# Patient Record
Sex: Female | Born: 1975 | Race: Black or African American | Hispanic: No | Smoking: Never smoker
Health system: Southern US, Community
[De-identification: ages and names within clinical notes are randomized; demographics above are authoritative.]

## PROBLEM LIST (undated history)

## (undated) DIAGNOSIS — I2699 Other pulmonary embolism without acute cor pulmonale: Secondary | ICD-10-CM

## (undated) DIAGNOSIS — I10 Essential (primary) hypertension: Secondary | ICD-10-CM

## (undated) DIAGNOSIS — IMO0002 Reserved for concepts with insufficient information to code with codable children: Secondary | ICD-10-CM

## (undated) DIAGNOSIS — I4891 Unspecified atrial fibrillation: Secondary | ICD-10-CM

## (undated) DIAGNOSIS — M329 Systemic lupus erythematosus, unspecified: Secondary | ICD-10-CM

## (undated) DIAGNOSIS — I509 Heart failure, unspecified: Secondary | ICD-10-CM

## (undated) DIAGNOSIS — R569 Unspecified convulsions: Secondary | ICD-10-CM

## (undated) DIAGNOSIS — E119 Type 2 diabetes mellitus without complications: Secondary | ICD-10-CM

## (undated) DIAGNOSIS — K859 Acute pancreatitis without necrosis or infection, unspecified: Secondary | ICD-10-CM

## (undated) DIAGNOSIS — J45909 Unspecified asthma, uncomplicated: Secondary | ICD-10-CM

## (undated) HISTORY — PX: CARDIAC SURGERY: SHX584

## (undated) HISTORY — PX: CORONARY ARTERY BYPASS GRAFT: SHX141

---

## 2017-06-25 ENCOUNTER — Emergency Department (HOSPITAL_COMMUNITY): Payer: Medicare Other

## 2017-06-25 ENCOUNTER — Emergency Department (HOSPITAL_COMMUNITY)
Admission: EM | Admit: 2017-06-25 | Discharge: 2017-06-25 | Disposition: A | Discharge status: Home / Self Care | Payer: Medicare Other | Attending: Emergency Medicine | Admitting: Emergency Medicine

## 2017-06-25 ENCOUNTER — Encounter (HOSPITAL_COMMUNITY): Payer: Self-pay

## 2017-06-25 DIAGNOSIS — R079 Chest pain, unspecified: Secondary | ICD-10-CM | POA: Diagnosis present

## 2017-06-25 DIAGNOSIS — E119 Type 2 diabetes mellitus without complications: Secondary | ICD-10-CM | POA: Insufficient documentation

## 2017-06-25 DIAGNOSIS — J45909 Unspecified asthma, uncomplicated: Secondary | ICD-10-CM | POA: Insufficient documentation

## 2017-06-25 DIAGNOSIS — I509 Heart failure, unspecified: Secondary | ICD-10-CM | POA: Insufficient documentation

## 2017-06-25 DIAGNOSIS — R600 Localized edema: Secondary | ICD-10-CM | POA: Diagnosis not present

## 2017-06-25 DIAGNOSIS — I110 Hypertensive heart disease with heart failure: Secondary | ICD-10-CM | POA: Insufficient documentation

## 2017-06-25 DIAGNOSIS — Z951 Presence of aortocoronary bypass graft: Secondary | ICD-10-CM | POA: Diagnosis not present

## 2017-06-25 DIAGNOSIS — R0602 Shortness of breath: Secondary | ICD-10-CM | POA: Diagnosis not present

## 2017-06-25 DIAGNOSIS — I272 Pulmonary hypertension, unspecified: Secondary | ICD-10-CM | POA: Insufficient documentation

## 2017-06-25 DIAGNOSIS — Z7901 Long term (current) use of anticoagulants: Secondary | ICD-10-CM | POA: Insufficient documentation

## 2017-06-25 DIAGNOSIS — Z79899 Other long term (current) drug therapy: Secondary | ICD-10-CM | POA: Diagnosis not present

## 2017-06-25 DIAGNOSIS — Z794 Long term (current) use of insulin: Secondary | ICD-10-CM | POA: Insufficient documentation

## 2017-06-25 HISTORY — DX: Essential (primary) hypertension: I10

## 2017-06-25 HISTORY — DX: Unspecified atrial fibrillation: I48.91

## 2017-06-25 HISTORY — DX: Other pulmonary embolism without acute cor pulmonale: I26.99

## 2017-06-25 HISTORY — DX: Type 2 diabetes mellitus without complications: E11.9

## 2017-06-25 HISTORY — DX: Heart failure, unspecified: I50.9

## 2017-06-25 HISTORY — DX: Acute pancreatitis without necrosis or infection, unspecified: K85.90

## 2017-06-25 HISTORY — DX: Unspecified asthma, uncomplicated: J45.909

## 2017-06-25 HISTORY — DX: Reserved for concepts with insufficient information to code with codable children: IMO0002

## 2017-06-25 HISTORY — DX: Unspecified convulsions: R56.9

## 2017-06-25 HISTORY — DX: Systemic lupus erythematosus, unspecified: M32.9

## 2017-06-25 LAB — TSH: TSH: 1.116 u[IU]/mL (ref 0.350–4.500)

## 2017-06-25 LAB — BASIC METABOLIC PANEL
Anion gap: 4 — ABNORMAL LOW (ref 5–15)
BUN: 9 mg/dL (ref 6–20)
CALCIUM: 8.6 mg/dL — AB (ref 8.9–10.3)
CO2: 26 mmol/L (ref 22–32)
Chloride: 105 mmol/L (ref 101–111)
Creatinine, Ser: 1.05 mg/dL — ABNORMAL HIGH (ref 0.44–1.00)
Glucose, Bld: 182 mg/dL — ABNORMAL HIGH (ref 65–99)
Potassium: 4.2 mmol/L (ref 3.5–5.1)
SODIUM: 135 mmol/L (ref 135–145)

## 2017-06-25 LAB — I-STAT BETA HCG BLOOD, ED (MC, WL, AP ONLY)

## 2017-06-25 LAB — CBC
HCT: 44.7 % (ref 36.0–46.0)
Hemoglobin: 13.8 g/dL (ref 12.0–15.0)
MCH: 26.9 pg (ref 26.0–34.0)
MCHC: 30.9 g/dL (ref 30.0–36.0)
MCV: 87.1 fL (ref 78.0–100.0)
Platelets: UNDETERMINED 10*3/uL (ref 150–400)
RBC: 5.13 MIL/uL — AB (ref 3.87–5.11)
RDW: 15 % (ref 11.5–15.5)
WBC: 5.8 10*3/uL (ref 4.0–10.5)

## 2017-06-25 LAB — BRAIN NATRIURETIC PEPTIDE: B Natriuretic Peptide: 181.7 pg/mL — ABNORMAL HIGH (ref 0.0–100.0)

## 2017-06-25 LAB — I-STAT TROPONIN, ED: TROPONIN I, POC: 0 ng/mL (ref 0.00–0.08)

## 2017-06-25 MED ORDER — IOPAMIDOL (ISOVUE-370) INJECTION 76%
INTRAVENOUS | Status: AC
  Administered 2017-06-25: 100 mL
  Filled 2017-06-25: qty 100

## 2017-06-25 MED ORDER — ACETAMINOPHEN 500 MG PO TABS: 1000.0000 mg | ORAL_TABLET | Freq: Once | ORAL | Status: DC

## 2017-06-25 NOTE — Care Management (Signed)
ED CM was consulted by Lennette Bihari RN concerning patient being a [employer] from [place] and being on home oxygen. CM met with patient at bedside she reports [employer] as her oxygen supplier. CM contacted [facility] who has agreed to deliver oxygen to her temporary address [place] [address] ph [phone]. Updated Dr.Schlossman and Charm Rings. Patient was also updated [facility] rep will contact her by phone at the number provided prior to delivery. Patient denies any issues with medications. No further ED CM needs identified

## 2017-06-25 NOTE — ED Notes (Signed)
This RN in room to speak with pt. Pt state that she does not want any family in room when any medical professional comes into the room to speak with pt about her care. Pt also agreed to accept CT scan. CT notified and coming to gt patient. Pt states "i just wish someone would listen to me instead of talking over me about my care" Pt had taken off ekg leads, pulses and blood pressure cuff. All are back in place. Pt appears to be in NAD.
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
## 2017-06-25 NOTE — ED Notes (Signed)
MD in room

## 2017-06-25 NOTE — ED Notes (Signed)
This RN in room with Dr Dalene Seltzer, pt's visitor asked to leave the room while we talk with pt regarding results. This RN drawing 2nd troponin from IV. Pt states "I am ready to go right now, I don't want to wait 10 more seconds here, and I feel like this nurse is wasting my blood" Tropnin was drawn and taken to mini lab. Pt stated "someone called me from the agency that takes care of my oxygen and they can't even get me the right machine for my oxygen." Pt educated that SW called the agency that supplies her oxygen and they said that they would deliver what the pt needed to where she is staying tonight. Pt will speak with agency regarding proper equipment. Pt appears agitated. Pt put her clothes on, VSS. Given d/c instructions and is going home with her visitor driving. NAD.

## 2017-06-25 NOTE — ED Notes (Signed)
Joanna Werner went into room and spoke with pt about her oxygen situation. Joanna Mortimer contacted the pt's oxygen supply agency and they are going to deliver oxygen to the placed where the pt is staying this evening.

## 2017-06-25 NOTE — ED Notes (Addendum)
Patient did not want to have CT done, feels it is unnecessary.  Wants MD to discuss.

## 2017-06-25 NOTE — ED Notes (Signed)
Pt taken to CT.
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
## 2017-06-28 LAB — I-STAT TROPONIN, ED: Troponin i, poc: 0 ng/mL (ref 0.00–0.08)

## 2018-06-04 IMAGING — DX DG CHEST 1V PORT
1 series · 1 of 1 positions shown · non-contrast
Comparison: No prior .

CLINICAL DATA: Chest pain.  Shortness of breath.

EXAM:
PORTABLE CHEST 1 VIEW

[chest ap]
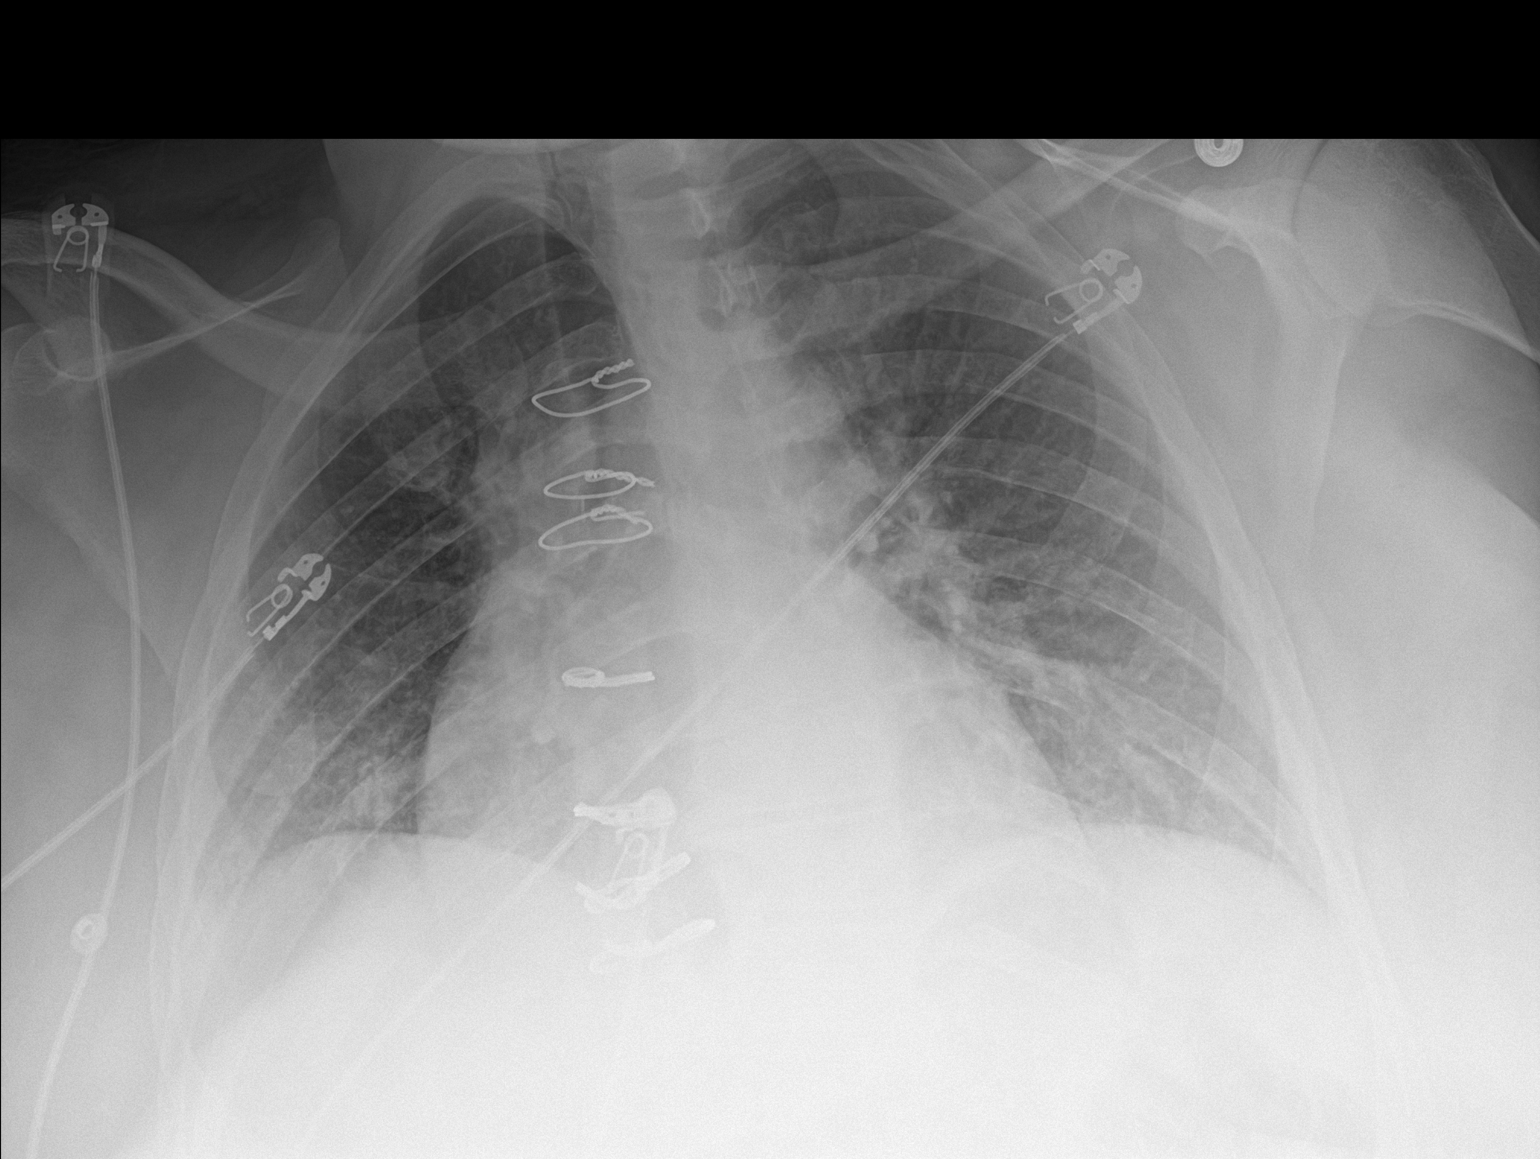

[1 of 1 positions shown; findings below may reference images not displayed]

FINDINGS: Prior CABG. Cardiomegaly with pulmonary vascular prominence and mild
interstitial prominence. Small right pleural effusion. Findings
suggest mild CHF. Low lung volumes with mild basilar atelectasis .
IMPRESSION: 1.  Prior CABG.  Mild CHF.

2. Low lung volumes with mild basilar atelectasis .

## 2019-05-13 DEATH — deceased
